# Patient Record
Sex: Female | Born: 2000 | Race: Black or African American | Hispanic: No | Marital: Single | Smoking: Never smoker
Health system: Southern US, Community
[De-identification: ages and names within clinical notes are randomized; demographics above are authoritative.]

## PROBLEM LIST (undated history)

## (undated) DIAGNOSIS — Z789 Other specified health status: Secondary | ICD-10-CM

## (undated) HISTORY — PX: HERNIA REPAIR: SHX51

---
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
## 2022-07-20 ENCOUNTER — Emergency Department (HOSPITAL_BASED_OUTPATIENT_CLINIC_OR_DEPARTMENT_OTHER)
Admission: EM | Admit: 2022-07-20 | Discharge: 2022-07-20 | Disposition: A | Discharge status: Home / Self Care | Payer: Self-pay | Attending: Emergency Medicine | Admitting: Emergency Medicine

## 2022-07-20 ENCOUNTER — Encounter (HOSPITAL_BASED_OUTPATIENT_CLINIC_OR_DEPARTMENT_OTHER): Payer: Self-pay

## 2022-07-20 ENCOUNTER — Other Ambulatory Visit: Payer: Self-pay

## 2022-07-20 DIAGNOSIS — E871 Hypo-osmolality and hyponatremia: Secondary | ICD-10-CM | POA: Insufficient documentation

## 2022-07-20 DIAGNOSIS — K529 Noninfective gastroenteritis and colitis, unspecified: Secondary | ICD-10-CM

## 2022-07-20 DIAGNOSIS — N390 Urinary tract infection, site not specified: Secondary | ICD-10-CM

## 2022-07-20 DIAGNOSIS — D72829 Elevated white blood cell count, unspecified: Secondary | ICD-10-CM | POA: Insufficient documentation

## 2022-07-20 DIAGNOSIS — B964 Proteus (mirabilis) (morganii) as the cause of diseases classified elsewhere: Secondary | ICD-10-CM | POA: Insufficient documentation

## 2022-07-20 DIAGNOSIS — B9620 Unspecified Escherichia coli [E. coli] as the cause of diseases classified elsewhere: Secondary | ICD-10-CM | POA: Insufficient documentation

## 2022-07-20 DIAGNOSIS — R112 Nausea with vomiting, unspecified: Secondary | ICD-10-CM

## 2022-07-20 HISTORY — DX: Other specified health status: Z78.9

## 2022-07-20 LAB — PREGNANCY, URINE: Preg Test, Ur: NEGATIVE

## 2022-07-20 LAB — COMPREHENSIVE METABOLIC PANEL
ALT: 17 U/L (ref 0–44)
AST: 20 U/L (ref 15–41)
Albumin: 4.4 g/dL (ref 3.5–5.0)
Alkaline Phosphatase: 59 U/L (ref 38–126)
Anion gap: 8 (ref 5–15)
BUN: 23 mg/dL — ABNORMAL HIGH (ref 6–20)
CO2: 20 mmol/L — ABNORMAL LOW (ref 22–32)
Calcium: 9.2 mg/dL (ref 8.9–10.3)
Chloride: 106 mmol/L (ref 98–111)
Creatinine, Ser: 0.82 mg/dL (ref 0.44–1.00)
GFR, Estimated: >60 mL/min (ref >60)
Glucose, Bld: 130 mg/dL — ABNORMAL HIGH (ref 70–99)
Potassium: 4.1 mmol/L (ref 3.5–5.1)
Sodium: 134 mmol/L — ABNORMAL LOW (ref 135–145)
Total Bilirubin: 0.9 mg/dL (ref 0.3–1.2)
Total Protein: 8.7 g/dL — ABNORMAL HIGH (ref 6.5–8.1)

## 2022-07-20 LAB — URINALYSIS, ROUTINE W REFLEX MICROSCOPIC
Bilirubin Urine: NEGATIVE
Glucose, UA: NEGATIVE mg/dL
Hgb urine dipstick: NEGATIVE
Ketones, ur: NEGATIVE mg/dL
Nitrite: NEGATIVE
Protein, ur: NEGATIVE mg/dL
Specific Gravity, Urine: >=1.03 (ref 1.005–1.030)
pH: 5 (ref 5.0–8.0)

## 2022-07-20 LAB — CBC WITH DIFFERENTIAL/PLATELET
Abs Immature Granulocytes: 0.04 10*3/uL (ref 0.00–0.07)
Basophils Absolute: 0 10*3/uL (ref 0.0–0.1)
Basophils Relative: 0 %
Eosinophils Absolute: 0 10*3/uL (ref 0.0–0.5)
Eosinophils Relative: 0 %
HCT: 41.7 % (ref 36.0–46.0)
Hemoglobin: 14.4 g/dL (ref 12.0–15.0)
Immature Granulocytes: 0 %
Lymphocytes Relative: 5 %
Lymphs Abs: 0.5 10*3/uL — ABNORMAL LOW (ref 0.7–4.0)
MCH: 31.1 pg (ref 26.0–34.0)
MCHC: 34.5 g/dL (ref 30.0–36.0)
MCV: 90.1 fL (ref 80.0–100.0)
Monocytes Absolute: 0.6 10*3/uL (ref 0.1–1.0)
Monocytes Relative: 6 %
Neutro Abs: 9.3 10*3/uL — ABNORMAL HIGH (ref 1.7–7.7)
Neutrophils Relative %: 89 %
Platelets: 219 10*3/uL (ref 150–400)
RBC: 4.63 MIL/uL (ref 3.87–5.11)
RDW: 12.2 % (ref 11.5–15.5)
WBC: 10.6 10*3/uL — ABNORMAL HIGH (ref 4.0–10.5)
nRBC: 0 % (ref 0.0–0.2)

## 2022-07-20 LAB — URINALYSIS, MICROSCOPIC (REFLEX)

## 2022-07-20 MED ORDER — ONDANSETRON 4 MG PO TBDP: 4.0000 mg | ORAL_TABLET | Freq: Three times a day (TID) | ORAL | 0 refills | Status: DC | PRN

## 2022-07-20 MED ORDER — CEPHALEXIN 500 MG PO CAPS: 500.0000 mg | ORAL_CAPSULE | Freq: Three times a day (TID) | ORAL | 0 refills | Status: AC

## 2022-07-20 MED ORDER — DICYCLOMINE HCL 10 MG PO CAPS
10.0000 mg | ORAL_CAPSULE | Freq: Once | ORAL | Status: AC
  Administered 2022-07-20: 10 mg via ORAL
  Filled 2022-07-20: qty 1

## 2022-07-20 MED ORDER — ONDANSETRON 4 MG PO TBDP
4.0000 mg | ORAL_TABLET | Freq: Once | ORAL | Status: AC
  Administered 2022-07-20: 4 mg via ORAL
  Filled 2022-07-20: qty 1

## 2022-07-20 MED ORDER — PANTOPRAZOLE SODIUM 20 MG PO TBEC: 40.0000 mg | DELAYED_RELEASE_TABLET | Freq: Every day | ORAL | 0 refills | Status: DC

## 2022-07-20 MED ORDER — PANTOPRAZOLE SODIUM 40 MG PO TBEC
40.0000 mg | DELAYED_RELEASE_TABLET | Freq: Once | ORAL | Status: AC
Start: 2022-07-20 — End: 2022-07-20
  Administered 2022-07-20: 40 mg via ORAL
  Filled 2022-07-20: qty 1

## 2022-07-20 MED ORDER — ONDANSETRON HCL 4 MG/2ML IJ SOLN
4.0000 mg | Freq: Once | INTRAMUSCULAR | Status: AC
  Administered 2022-07-20: 4 mg via INTRAVENOUS
  Filled 2022-07-20: qty 2

## 2022-07-20 MED ORDER — LACTATED RINGERS IV BOLUS
1000.0000 mL | Freq: Once | INTRAVENOUS | Status: AC
  Administered 2022-07-20: 1000 mL via INTRAVENOUS

## 2022-07-20 MED ORDER — DICYCLOMINE HCL 20 MG PO TABS: 20.0000 mg | ORAL_TABLET | Freq: Two times a day (BID) | ORAL | 0 refills | Status: DC | PRN

## 2022-07-20 NOTE — ED Notes (Signed)
Pt in bed, pt states that she is feeling a little bit better, gatorade/water and crackers given for po challenge.

## 2022-07-20 NOTE — Discharge Instructions (Addendum)
In addition to the nausea medicine we prescribed, you can take imodium over the counter to decrease diarrhea.

## 2022-07-20 NOTE — ED Notes (Signed)
Pt vomited a small amount of gatorade/water, pt states that she does feel better and is wanting to go home, MD notified that pt has vomited, but that pt would still like to go home.  Pt [place] for discharge. Reviewed procedure of small amounts of fluid or cracker are easier on your stomach than large amounts, pt states that she drank the entire glass of water and then half of the gatorade/water cup.  Explained that smaller amounts would be easier on her stomach and less likely to make her vomit, reviewed brat diet and to advance diet slowly.  Pt states that she is feeling better and wants to go home, reviewed d/c instructions and follow up, pt ambulatory from dpt.

## 2022-07-20 NOTE — ED Notes (Signed)
Pt in bed, pt denies pain, pt reports decreased/ no nausea, pt stats that she is ready to go home,

## 2022-07-20 NOTE — ED Notes (Signed)
Pt walked back into room and states that she would like blood work now, md notified.

## 2022-07-20 NOTE — ED Provider Notes (Addendum)
[facility] Provider Note   CSN: [id number] Arrival date & time: 07/20/22  [id number]     History  Chief Complaint  Patient presents with   Diarrhea   Vomiting    Abigail Andrews is a 22 y.o. female.  HPI      22yo female with no significant medical history presents with concern for nausea, vomiting and diarrhea.    Reports difficulty sleeping last night, couldn't get comfortable, at first thought was constipated but then this AM woke up about 2 hours ago with watery diarrhea, nausea and vomiting.   Reports diarrhea coming out like water. Has had 4 episodes over 2 hours. Severe nausea, more dry heaves than productive vomiting.  Mild lower back pain. Denies abdominal pain.  No recent antibiotics, travel, suspicious foods or known sick contacts.    Past Medical History:  Diagnosis Date   No pertinent past medical history     Home Medications Prior to Admission medications   Medication Sig Start Date End Date Taking? Authorizing Provider  cephALEXin (KEFLEX) 500 MG capsule Take 1 capsule (500 mg total) by mouth 3 (three) times daily for 10 days. 07/20/22 07/30/22 Yes Gareth Morgan, MD  ondansetron (ZOFRAN-ODT) 4 MG disintegrating tablet Take 1 tablet (4 mg total) by mouth every 8 (eight) hours as needed for nausea or vomiting. 07/20/22  Yes Gareth Morgan, MD      Allergies    Patient has no known allergies.    Review of Systems   Review of Systems  Physical Exam Updated Vital Signs BP (!) 125/92 (BP Location: Right Arm)   Pulse 100   Temp 98.2 F (36.8 C) (Oral)   Resp 18   Ht 5' 4"$  (1.626 m)   Wt 68 kg   LMP 06/20/2022 (Exact Date)   SpO2 99%   BMI 25.75 kg/m  Physical Exam Vitals and nursing note reviewed.  Constitutional:      General: She is not in acute distress.    Appearance: Normal appearance. She is not ill-appearing, toxic-appearing or diaphoretic.  HENT:     Head: Normocephalic.  Eyes:      Conjunctiva/sclera: Conjunctivae normal.  Cardiovascular:     Rate and Rhythm: Normal rate and regular rhythm.     Pulses: Normal pulses.  Pulmonary:     Effort: Pulmonary effort is normal. No respiratory distress.  Abdominal:     General: There is no distension.     Palpations: Abdomen is soft.     Tenderness: There is abdominal tenderness (mild suprapubic).  Musculoskeletal:        General: No deformity or signs of injury.     Cervical back: No rigidity.  Skin:    General: Skin is warm and dry.     Coloration: Skin is not jaundiced or pale.  Neurological:     General: No focal deficit present.     Mental Status: She is alert and oriented to person, place, and time.     ED Results / Procedures / Treatments   Labs (all labs ordered are listed, but only abnormal results are displayed) Labs Reviewed  URINALYSIS, ROUTINE W REFLEX MICROSCOPIC - Abnormal; Notable for the following components:      Result Value   APPearance CLOUDY (*)    Leukocytes,Ua TRACE (*)    All other components within normal limits  URINALYSIS, MICROSCOPIC (REFLEX) - Abnormal; Notable for the following components:   Bacteria, UA MANY (*)    Non Squamous  Epithelial PRESENT (*)    All other components within normal limits  PREGNANCY, URINE    EKG None  Radiology No results found.  Procedures Procedures    Medications Ordered in ED Medications  ondansetron (ZOFRAN-ODT) disintegrating tablet 4 mg (4 mg Oral Given 07/20/22 0734)    ED Course/ Medical Decision Making/ A&P                              22yo female with no significant medical history presents with concern for nausea, vomiting and diarrhea.  Does not have pain on history or focal exam findings and doubt appendicitis, diverticulitis, SBO, TOA, torsion, pancreatitis, cholecystitis.   History most consistent with gastroenteritis.  Not on recent antibiotics and low suspicion for CDiff at this time.  No baseline medical problems,  discussed options of care today-- given symptoms began 2 hours ago have low suspicion for clinically significant electrolyte/lab abnormalities and feel we can forego labs but discussed we can send them/give IV fluids if she desires.  Urine pregnancy negative.  Urinalysis ordered due to vomiting shows possible contamination however many bacteria, WBC clumps, will treat as infection given low back pain and vomiting with keflex TID for 10 days.   Given zofran and po challenge performed and feels improved.  Recommend zofran, imodium and continued supportive care.   Immediately after discharge she did walk back into the room and said she is interested in labwork to ensure no significant electrolyte abnormalities.  She drank a glass of water and half of the gatorade and did vomit again, at this time feel it is reasonable to obtain labs and give IV fluids.   Labs completed and personally about interpreted by me show mild leukocytosis with left shift, CMP without clinically significant electrolyte abnormalities, mild hyponatremia and decrease bicarb consistent with diarrhea.  Does report some burning abdominal pain on evaluation that came and went, reexamination again benign and doubt appendicitis/choleycstitis/SBO/pancreatitis without significant or continued pain. Suspect the pain that was present secondary to gastritis in setting of virla illness. Will give pantoprazole and bentyl and rx for same.  Now tolerating po fluids.  Patient discharged in stable condition with understanding of reasons to return.          Final Clinical Impression(s) / ED Diagnoses Final diagnoses:  Nausea vomiting and diarrhea  Gastroenteritis  Urinary tract infection without hematuria, site unspecified    Rx / DC Orders ED Discharge Orders          Ordered    cephALEXin (KEFLEX) 500 MG capsule  3 times daily        07/20/22 0811    ondansetron (ZOFRAN-ODT) 4 MG disintegrating tablet  Every 8 hours PRN        07/20/22  [id number]             Gareth Morgan, MD 07/20/22 1038

## 2022-07-20 NOTE — ED Triage Notes (Signed)
Emesis and diarrhea for the past hour. Lower abdominal pain.

## 2022-07-21 LAB — URINE CULTURE

## 2022-07-22 LAB — URINE CULTURE: Culture: >=100000 — AB

## 2022-07-23 ENCOUNTER — Telehealth (HOSPITAL_BASED_OUTPATIENT_CLINIC_OR_DEPARTMENT_OTHER): Payer: Self-pay

## 2022-07-23 NOTE — Telephone Encounter (Signed)
Post ED Visit - Positive Culture Follow-up  Culture report reviewed by antimicrobial stewardship pharmacist: [facility] Team [x]$  Mal Misty [place], [place].D. []$  Heide Guile, Pharm.D., BCPS AQ-ID []$  Parks Neptune, Pharm.D., BCPS []$  Alycia Rossetti, Pharm.D., BCPS []$  [place], [place].D., BCPS, AAHIVP []$  Legrand Como, Pharm.D., BCPS, AAHIVP []$  Salome Arnt, PharmD, BCPS []$  Johnnette Gourd, PharmD, BCPS []$  Hughes Better, PharmD, BCPS []$  Leeroy Cha, PharmD []$  Laqueta Linden, PharmD, BCPS []$  Albertina Parr, PharmD  [place] Team []$  Leodis Sias, PharmD []$  Lindell Spar, PharmD []$  Royetta Asal, PharmD []$  Graylin Shiver, Rph []$  Rema Fendt) Glennon Mac, PharmD []$  Arlyn Dunning, PharmD []$  Netta Cedars, PharmD []$  Dia Sitter, PharmD []$  Leone Haven, PharmD []$  Gretta Arab, PharmD []$  Theodis Shove, PharmD []$  Peggyann Juba, PharmD []$  Reuel Boom, PharmD   Positive urine culture Treated with Cephalexin, organism sensitive to the same and no further patient follow-up is required at this time.  Glennon [place] 07/23/2022, 9:36 AM

## 2022-12-23 IMAGING — DX DG ABDOMEN 1V
1 series · 1 of 1 positions shown · non-contrast
Comparison: 06/25/2003

CLINICAL DATA: Bloating

EXAM:
ABDOMEN - 1 VIEW

[abdomen kub]
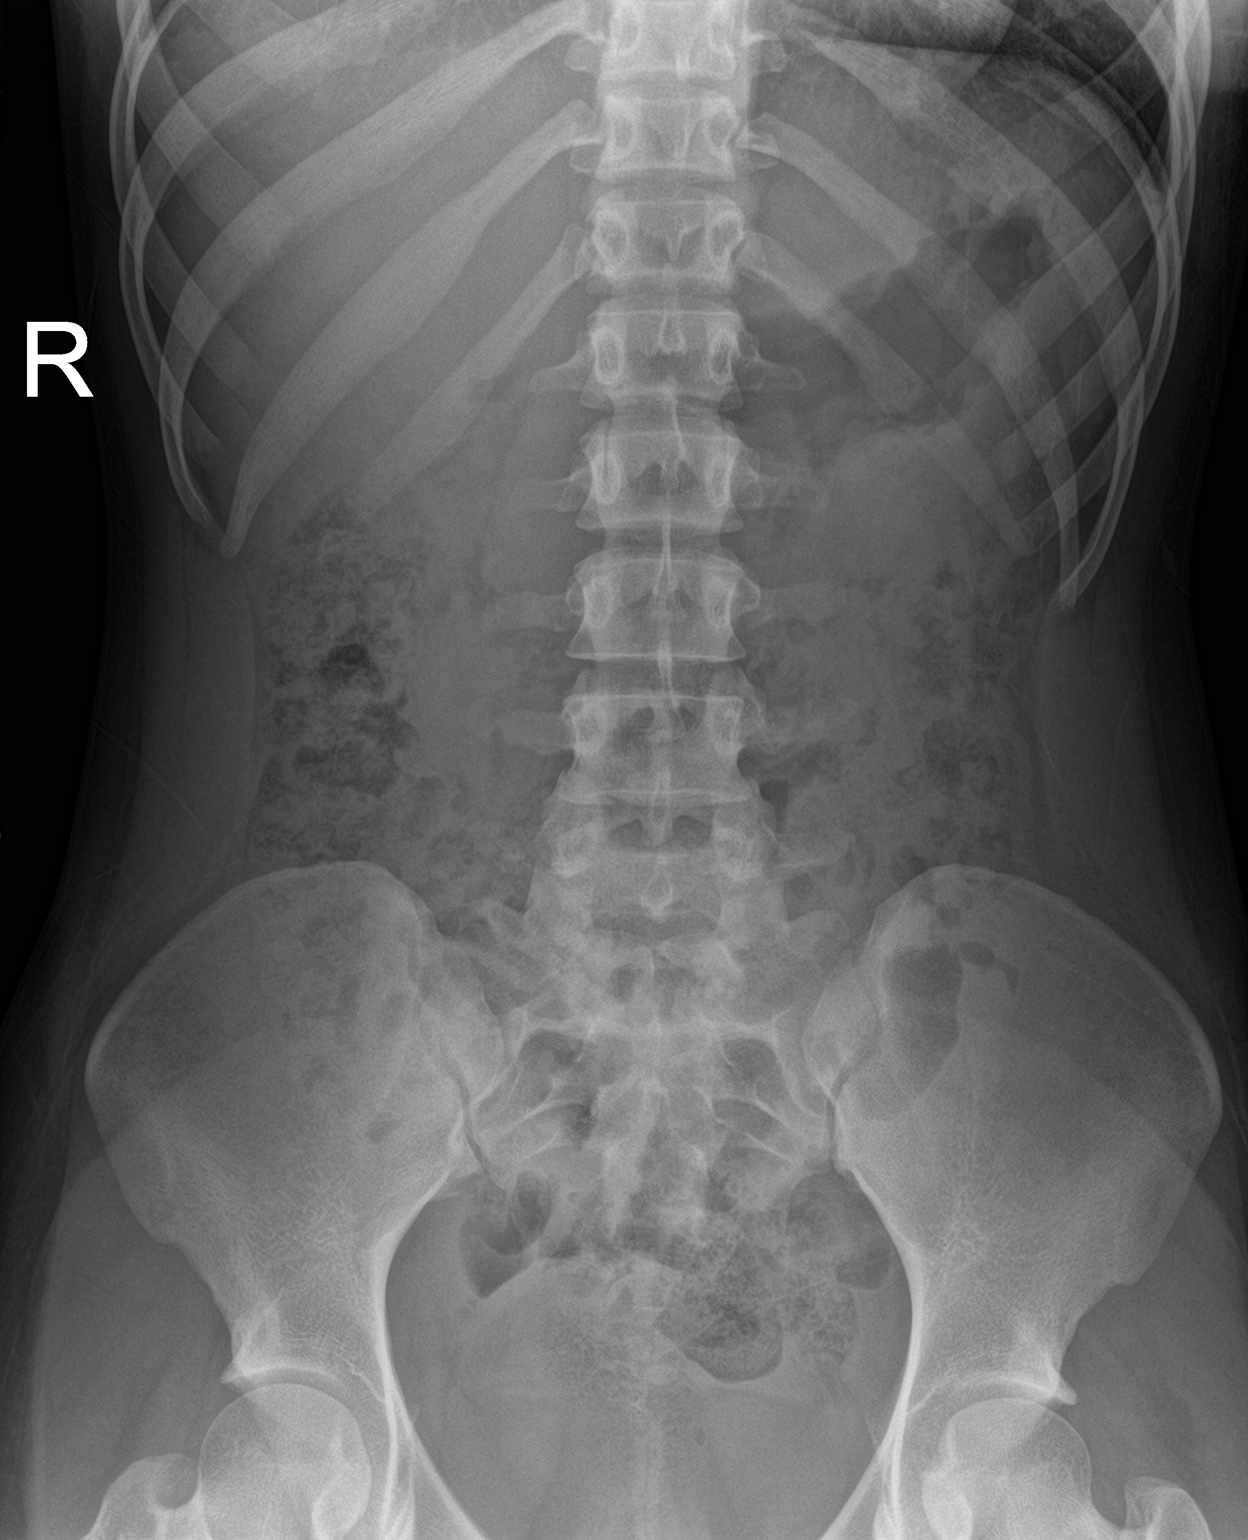

[1 of 1 positions shown; findings below may reference images not displayed]

FINDINGS: Moderate stool burden throughout the colon. There is a non
obstructive bowel gas pattern. No supine evidence of free air. No
organomegaly or suspicious calcification. No acute bony abnormality.
IMPRESSION: Moderate stool burden.  No acute findings.

## 2023-02-05 DIAGNOSIS — M545 Low back pain, unspecified: Secondary | ICD-10-CM | POA: Diagnosis not present

## 2023-02-05 DIAGNOSIS — Z113 Encounter for screening for infections with a predominantly sexual mode of transmission: Secondary | ICD-10-CM | POA: Diagnosis not present

## 2023-02-05 DIAGNOSIS — R1030 Lower abdominal pain, unspecified: Secondary | ICD-10-CM | POA: Diagnosis not present

## 2023-02-05 DIAGNOSIS — R350 Frequency of micturition: Secondary | ICD-10-CM | POA: Diagnosis not present

## 2023-02-05 DIAGNOSIS — N898 Other specified noninflammatory disorders of vagina: Secondary | ICD-10-CM | POA: Diagnosis not present

## 2023-03-04 ENCOUNTER — Ambulatory Visit: Payer: Self-pay | Admitting: Family Medicine

## 2023-03-17 ENCOUNTER — Telehealth: Payer: Medicaid Other | Admitting: Physician Assistant

## 2023-03-17 DIAGNOSIS — R102 Pelvic and perineal pain: Secondary | ICD-10-CM

## 2023-03-18 NOTE — Progress Notes (Signed)
Because of pins and needles sensation of the vagina which raises concern for other causes of your current symptoms, and need for exam and urine testing, I feel your condition warrants further evaluation and I recommend that you be seen in a face to face visit.   NOTE: There will be NO CHARGE for this eVisit   If you are having a true medical emergency please call 911.      For an urgent face to face visit, [facility] has eight urgent care centers for your convenience:   NEW!! [facility] at [facility] Get Driving Directions [phone] [address] [place], [address]    [facility] Urgent Care Center at [facility] Get Driving Directions [phone] [address] [place], [place] [address]   [facility] Health Urgent Care Center [facility]) Get Driving Directions [phone] [address] [place], [place] [address]  [facility] Health Urgent Care Center [facility]) Get Driving Directions [phone] [address] [place],  [place]  [address]  [facility] Urgent Care Center [facility] - at [employer]  [phone] [address] [place],  [place] [address]   [facility] Health Urgent Care at [facility] Get Driving Directions [phone] [address] [place], [place] [address]   [facility] Health Urgent Care at [facility] Get Driving Directions  [phone] [address] [place], [place] [address]   [facility] Health Urgent Care at [facility] Directions [phone] [address] [place], [place] [address]  Your MyChart E-visit questionnaire answers were reviewed by a board certified advanced clinical practitioner to complete your personal care plan based on your specific symptoms.  Thank you for using e-Visits.

## 2023-03-18 NOTE — Progress Notes (Signed)
Message sent to patient requesting further input regarding current symptoms. Awaiting patient response.  

## 2023-04-12 ENCOUNTER — Telehealth: Payer: 59 | Admitting: Emergency Medicine

## 2023-04-12 DIAGNOSIS — N898 Other specified noninflammatory disorders of vagina: Secondary | ICD-10-CM

## 2023-04-13 NOTE — Progress Notes (Signed)
Because clear discharge within 2 weeks of your period likely indicates you are ovulating, and milky white discharge can be normal cervical fluid, I do not recommend treatment at this time. If you feel something is not right, you will need further evaluation that I cannot provide by Evisit and I recommend that you be seen for a face to face visit.  Please contact your primary care physician practice to be seen. Many offices offer virtual options to be seen via video if you are not comfortable going in person to a medical facility at this time.  NOTE: You will NOT be charged for this eVisit.  If you do not have a PCP, [facility] offers a free physician referral service available at [phone]. Our trained staff has the experience, knowledge and resources to put you in touch with a physician who is right for you.    If you are having a true medical emergency please call 911.   Your e-visit answers were reviewed by a board certified advanced clinical practitioner to complete your personal care plan.  Thank you for using e-Visits.
